# Patient Record
Sex: Male | Born: 1955 | Hispanic: No | Marital: Married | Smoking: Current every day smoker
Health system: Southern US, Community
[De-identification: ages and names within clinical notes are randomized; demographics above are authoritative.]

---

## 1999-09-06 ENCOUNTER — Ambulatory Visit (HOSPITAL_COMMUNITY): Admission: RE | Admit: 1999-09-06 | Discharge: 1999-09-06 | Payer: Self-pay | Admitting: Emergency Medicine

## 1999-09-06 ENCOUNTER — Encounter: Payer: Self-pay | Admitting: Emergency Medicine

## 1999-09-13 ENCOUNTER — Encounter: Payer: Self-pay | Admitting: Urology

## 1999-09-13 ENCOUNTER — Ambulatory Visit (HOSPITAL_COMMUNITY): Admission: RE | Admit: 1999-09-13 | Discharge: 1999-09-13 | Payer: Self-pay | Admitting: Urology

## 2000-11-12 ENCOUNTER — Emergency Department (HOSPITAL_COMMUNITY): Admission: EM | Admit: 2000-11-12 | Discharge: 2000-11-12 | Payer: Self-pay | Admitting: Emergency Medicine

## 2000-11-15 ENCOUNTER — Emergency Department (HOSPITAL_COMMUNITY): Admission: EM | Admit: 2000-11-15 | Discharge: 2000-11-15 | Payer: Self-pay | Admitting: *Deleted

## 2002-12-12 ENCOUNTER — Emergency Department (HOSPITAL_COMMUNITY): Admission: EM | Admit: 2002-12-12 | Discharge: 2002-12-12 | Payer: Self-pay | Admitting: Emergency Medicine

## 2004-09-05 ENCOUNTER — Inpatient Hospital Stay (HOSPITAL_COMMUNITY): Admission: EM | Admit: 2004-09-05 | Discharge: 2004-09-06 | Payer: Self-pay | Admitting: Emergency Medicine

## 2006-05-13 IMAGING — US US ABDOMEN COMPLETE
1 series · 14 of 25 positions shown · non-contrast
Comparison: none

CLINICAL DATA: Abdominal pain.  E.D. patient.
ABDOMEN ULTRASOUND:
TECHNIQUE: Complete abdominal ultrasound examination was performed including evaluation of the liver, gallbladder, bile ducts, pancreas, kidneys, spleen, IVC, and abdominal aorta.
Gallbladder distention.  Gallstone measuring approximately 1.6 cm in diameter is noted.  There is sludge in the gallbladder.  Wall thickness is 2.4 mm.  Negative Murphy?s sign.  Common duct caliber 4.7 mm is well within normal limits.  Findings compatible with diffuse hepatocellular disease, likely fatty infiltration.  Negative pancreas, spleen, and kidneys.  Spleen measures 11.2 cm in length and the right and left kidneys measures 11.9 and 13.1 cm in length, respectively.  Abdominal aorta maximum diameter is 1.9 cm.  Patent IVC.

[Series 1: unknown · 0.33mm/px · 14 of 55 slices shown]
[im 1/55]
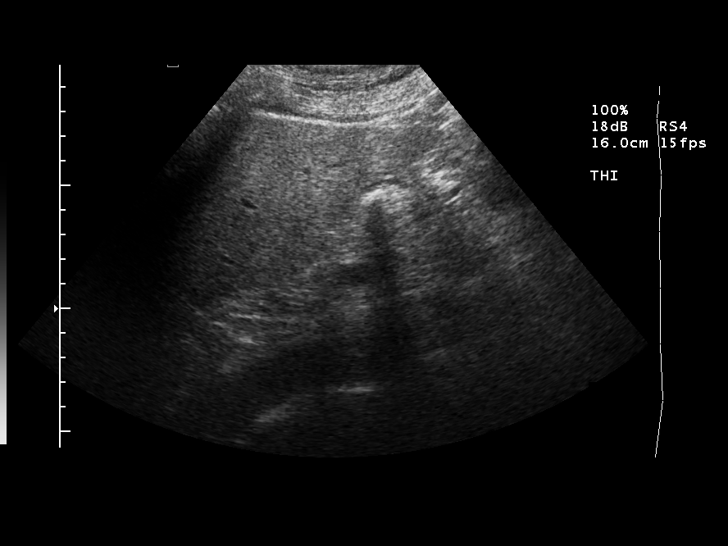
[im 5/55]
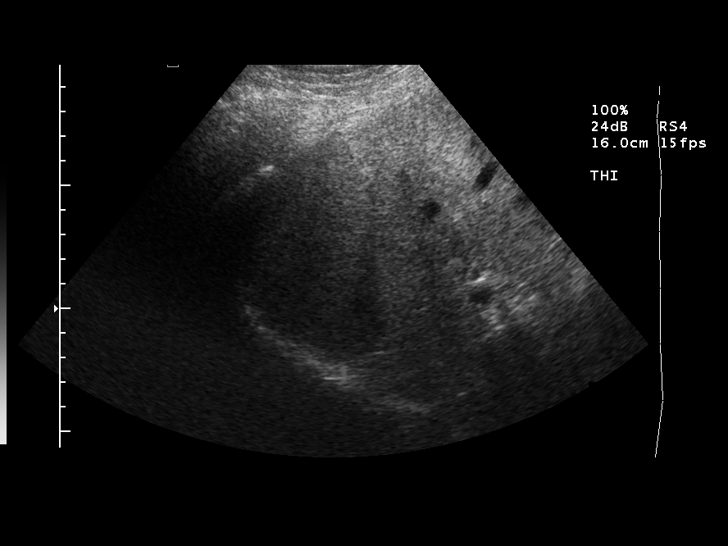
[im 10/55]
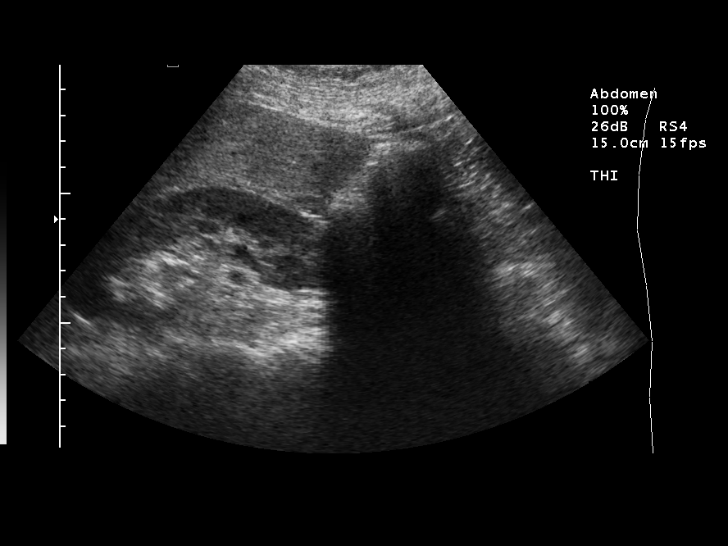
[im 14/55]
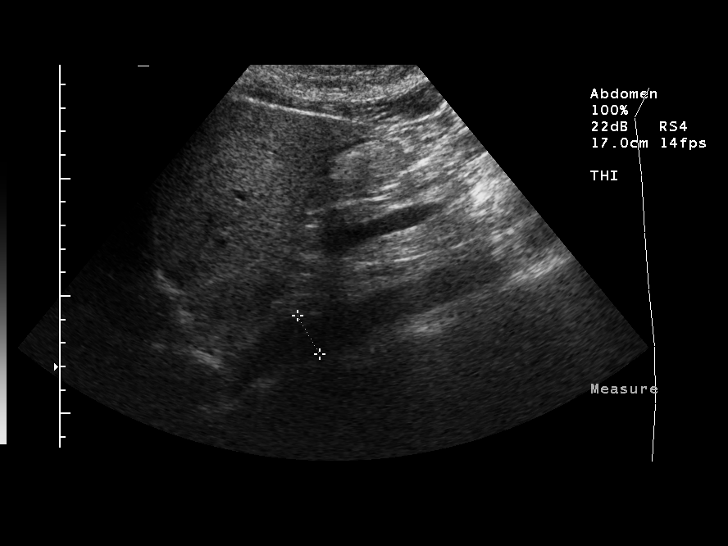
[im 19/55]
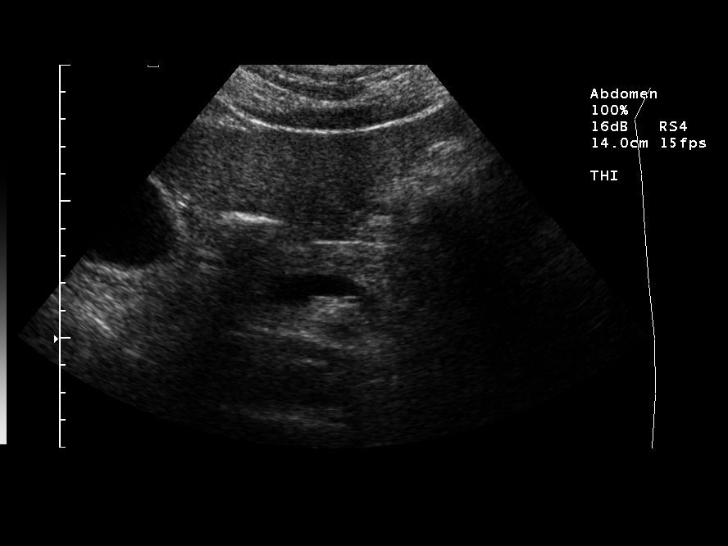
[im 21/55]
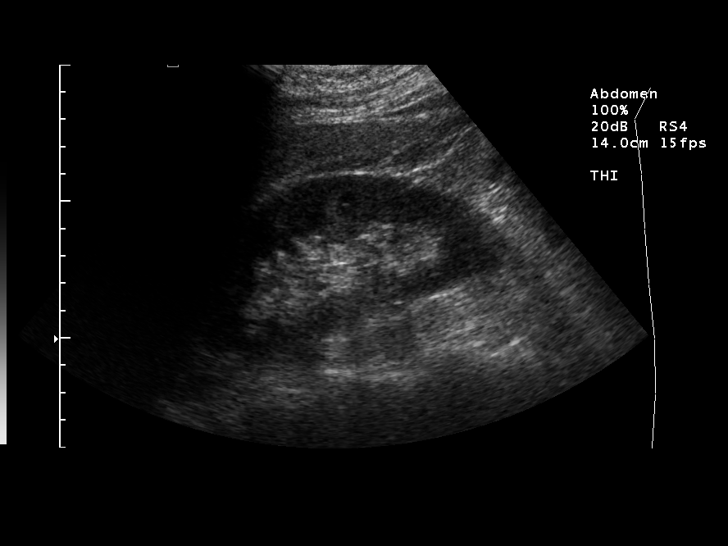
[im 25/55]
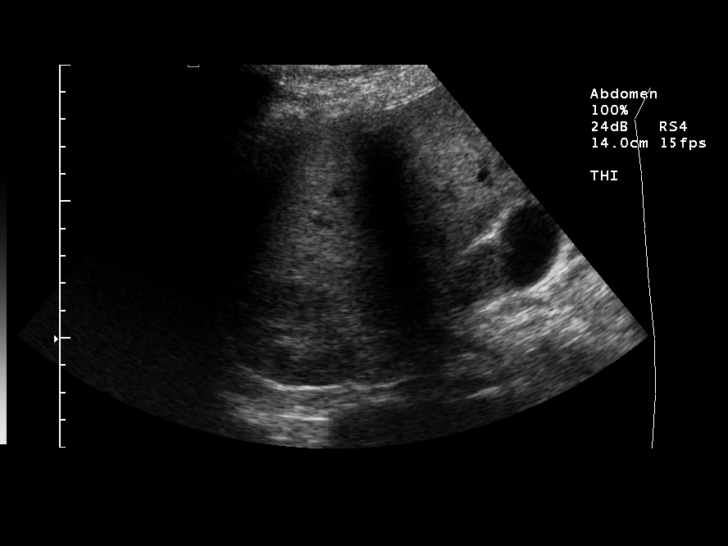
[im 30/55]
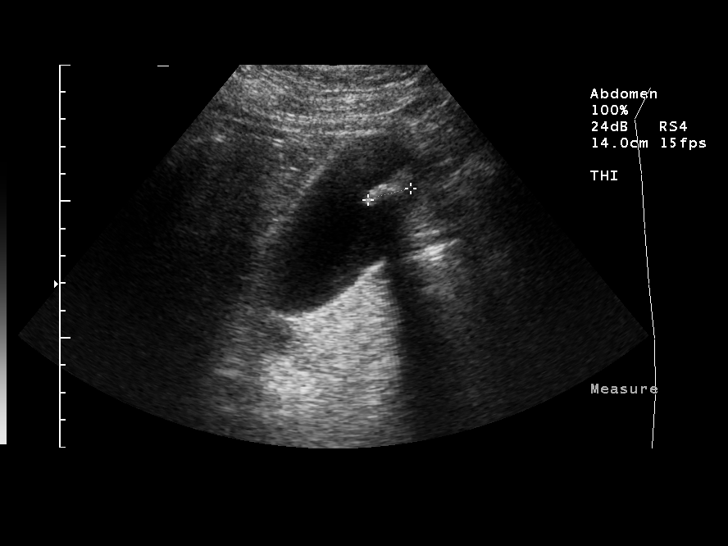
[im 34/55]
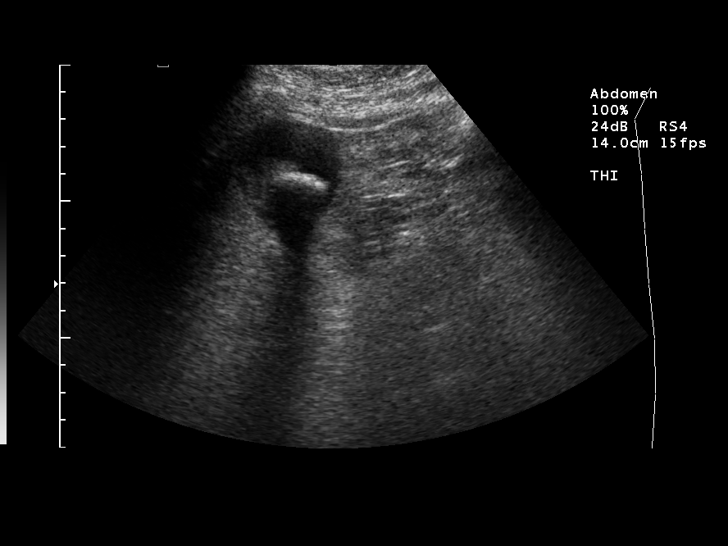
[im 37/55]
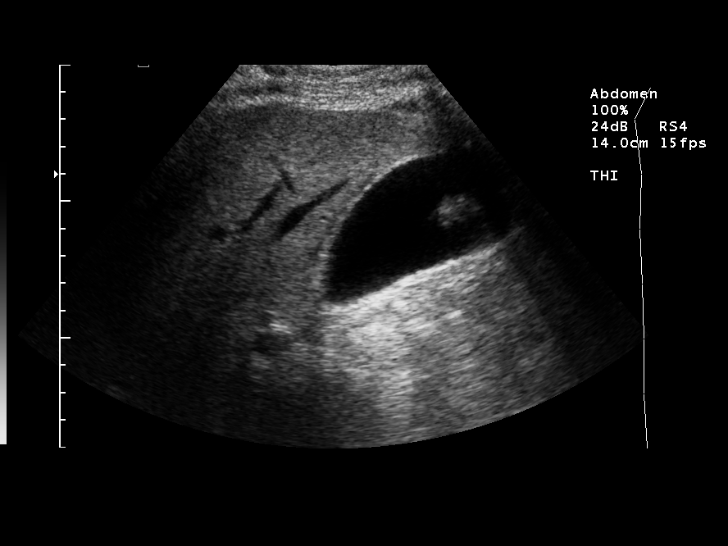
[im 41/55]
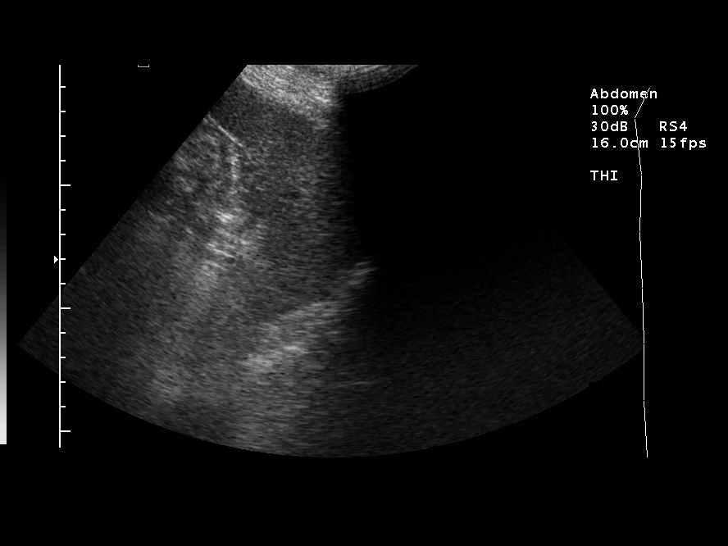
[im 46/55]
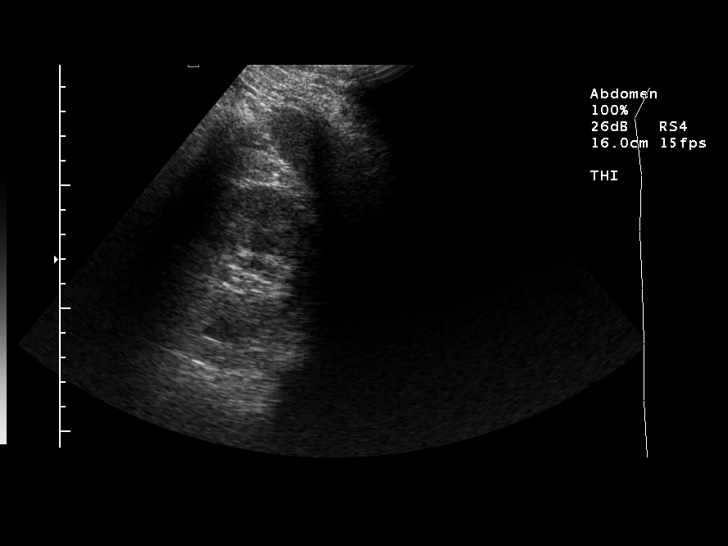
[im 50/55]
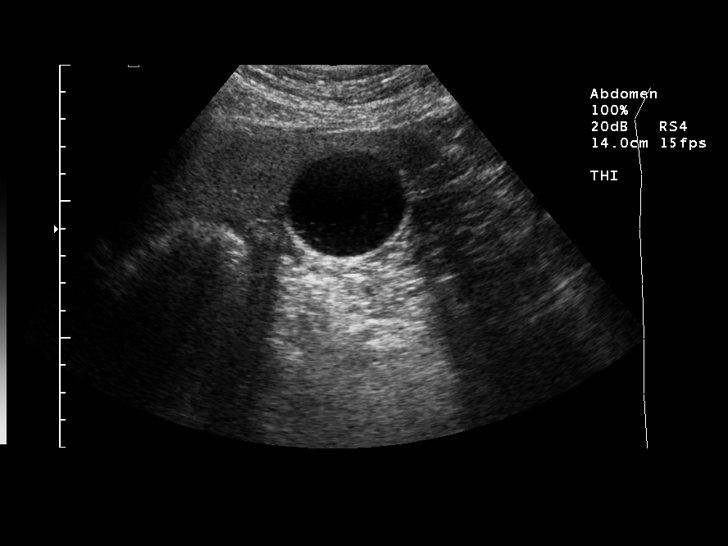
[im 55/55]
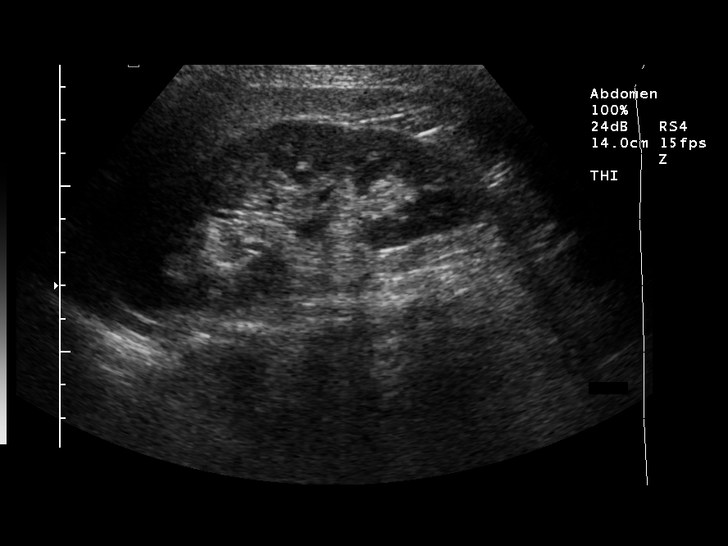

[14 of 25 positions shown; findings below may reference images not displayed]

IMPRESSION: Cholelithiasis.  Diffuse hepatocellular disease ? favor fatty infiltration.  No biliary ductal dilatation.

## 2006-05-13 IMAGING — RF DG CHOLANGIOGRAM OPERATIVE
1 series · 4 of 4 positions shown · non-contrast
Comparison: Abdominal ultrasound 09/05/04.

CLINICAL DATA: Acute cholecystitis.  
INTRAOPERATIVE CHOLANGIOGRAM:
A fluoroscopic series is submitted postoperatively from the operating room.

[Series 1: run · 4 of 49 frames shown]
[frame 8/49]
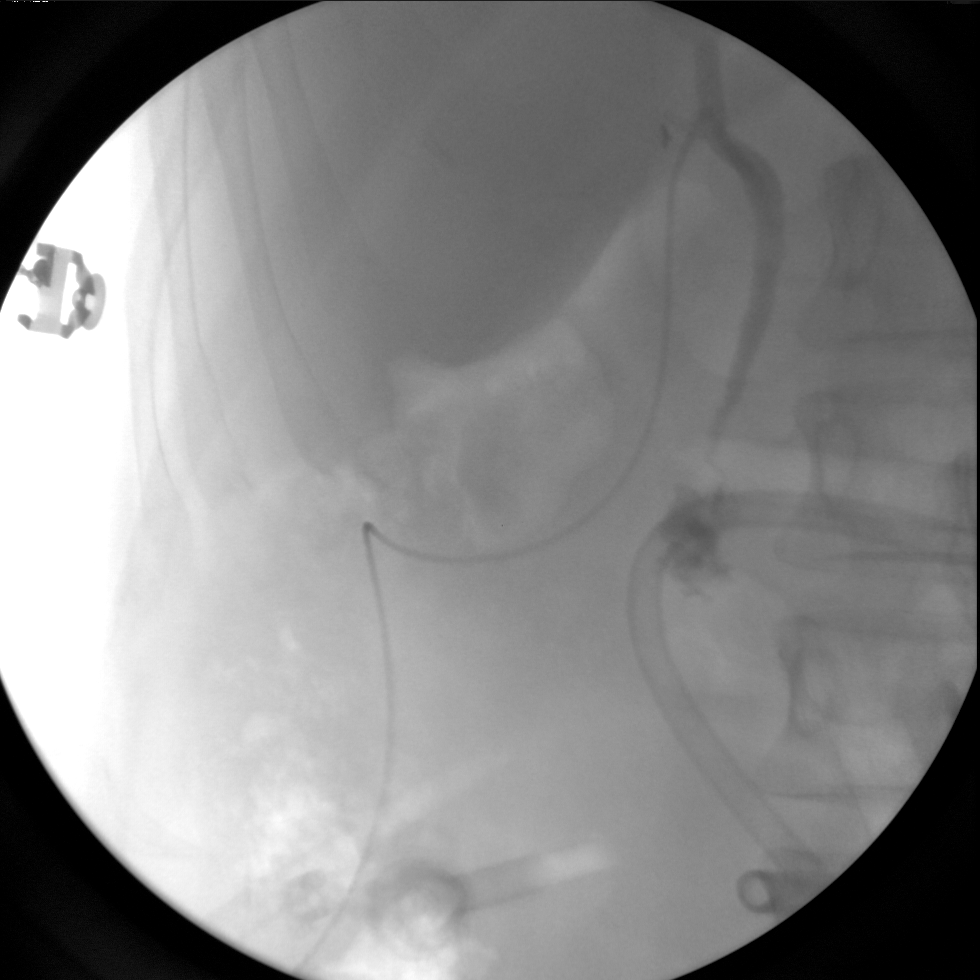
[frame 21/49]
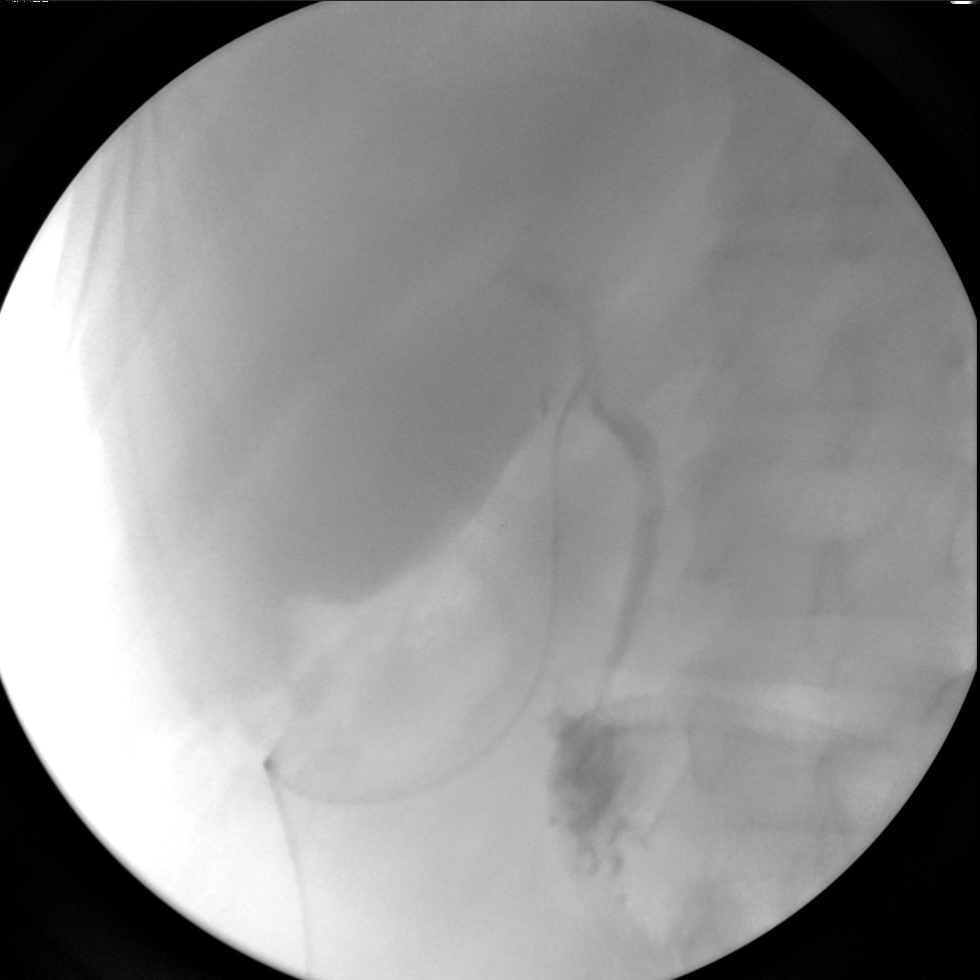
[frame 25/49]
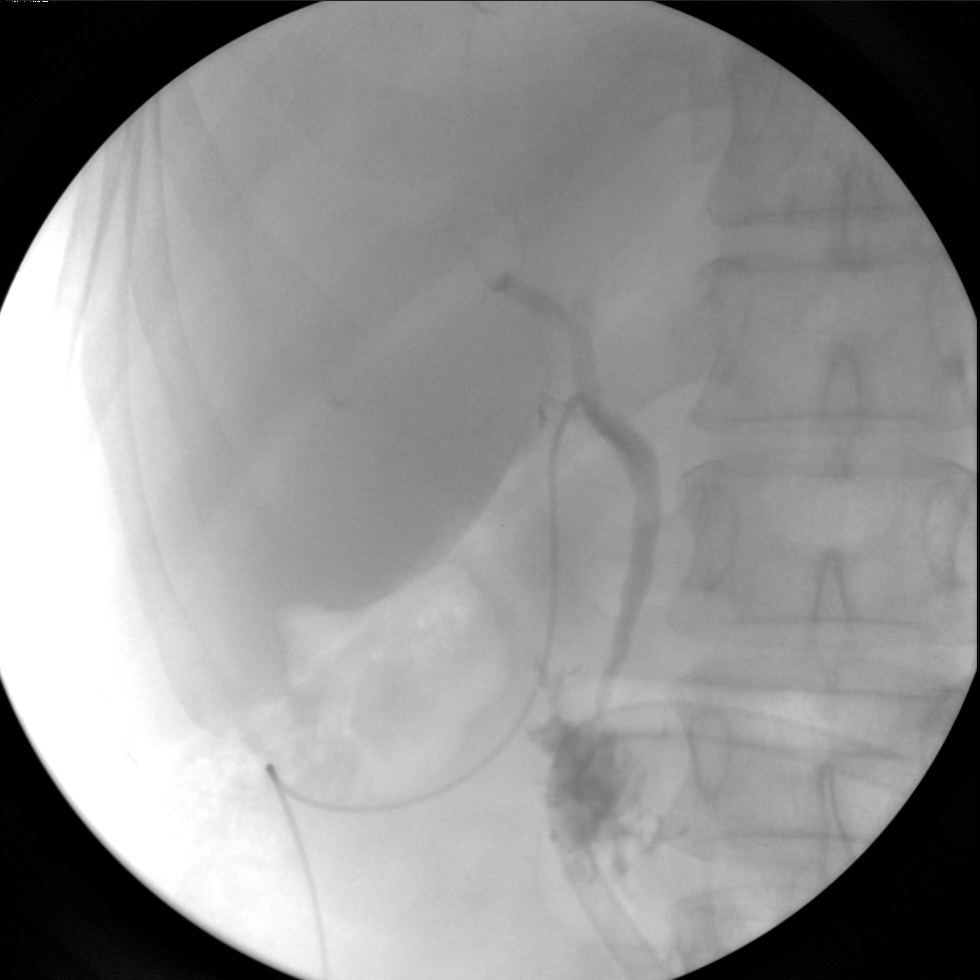
[frame 42/49]
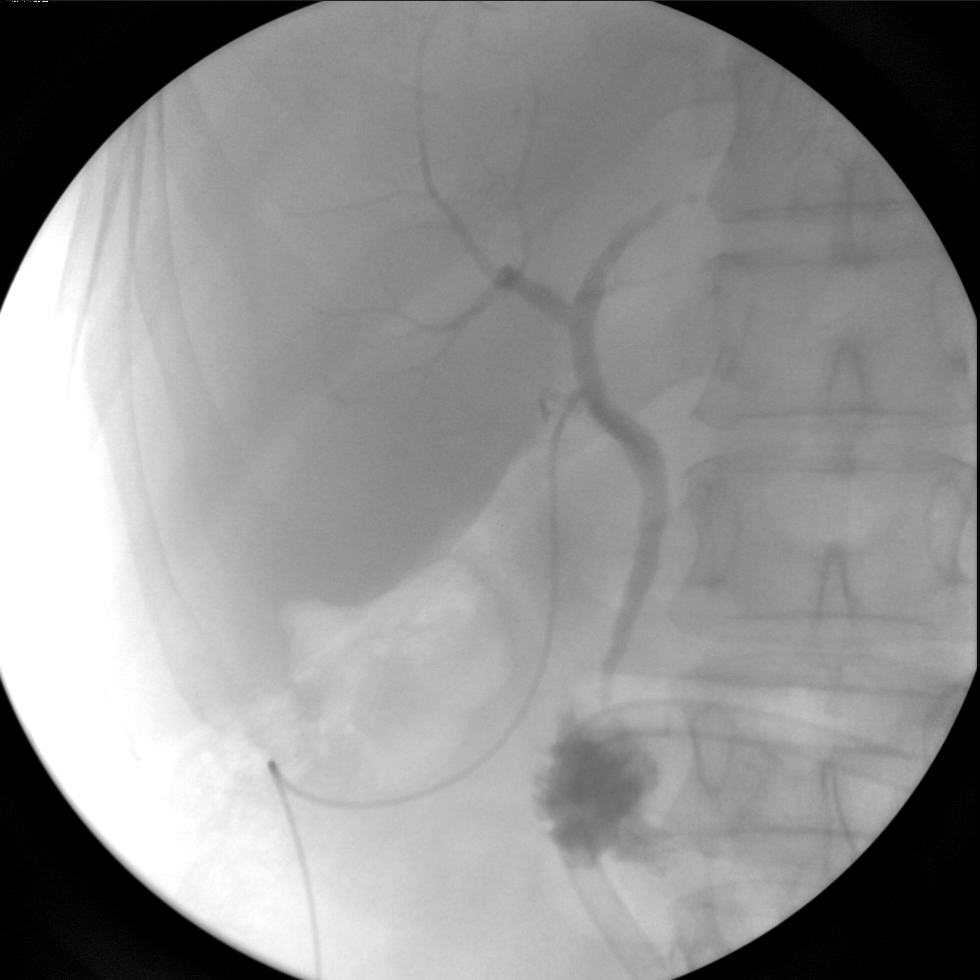

[4 of 4 positions shown; findings below may reference images not displayed]

FINDINGS: On injection of the cystic duct remnant, the common bile duct is noted to be normal in caliber without retained calculi.  There is drainage into the duodenum and no evidence of extravasation.
IMPRESSION: Negative intraoperative cholangiogram.

## 2006-05-13 IMAGING — CR DG CHEST 1V PORT
1 series · 1 of 1 positions shown · non-contrast
Comparison: None.

CLINICAL DATA: Abdominal pain. Precholecystectomy evaluation.

PORTABLE CHEST - 1 VIEW

[view not recorded]
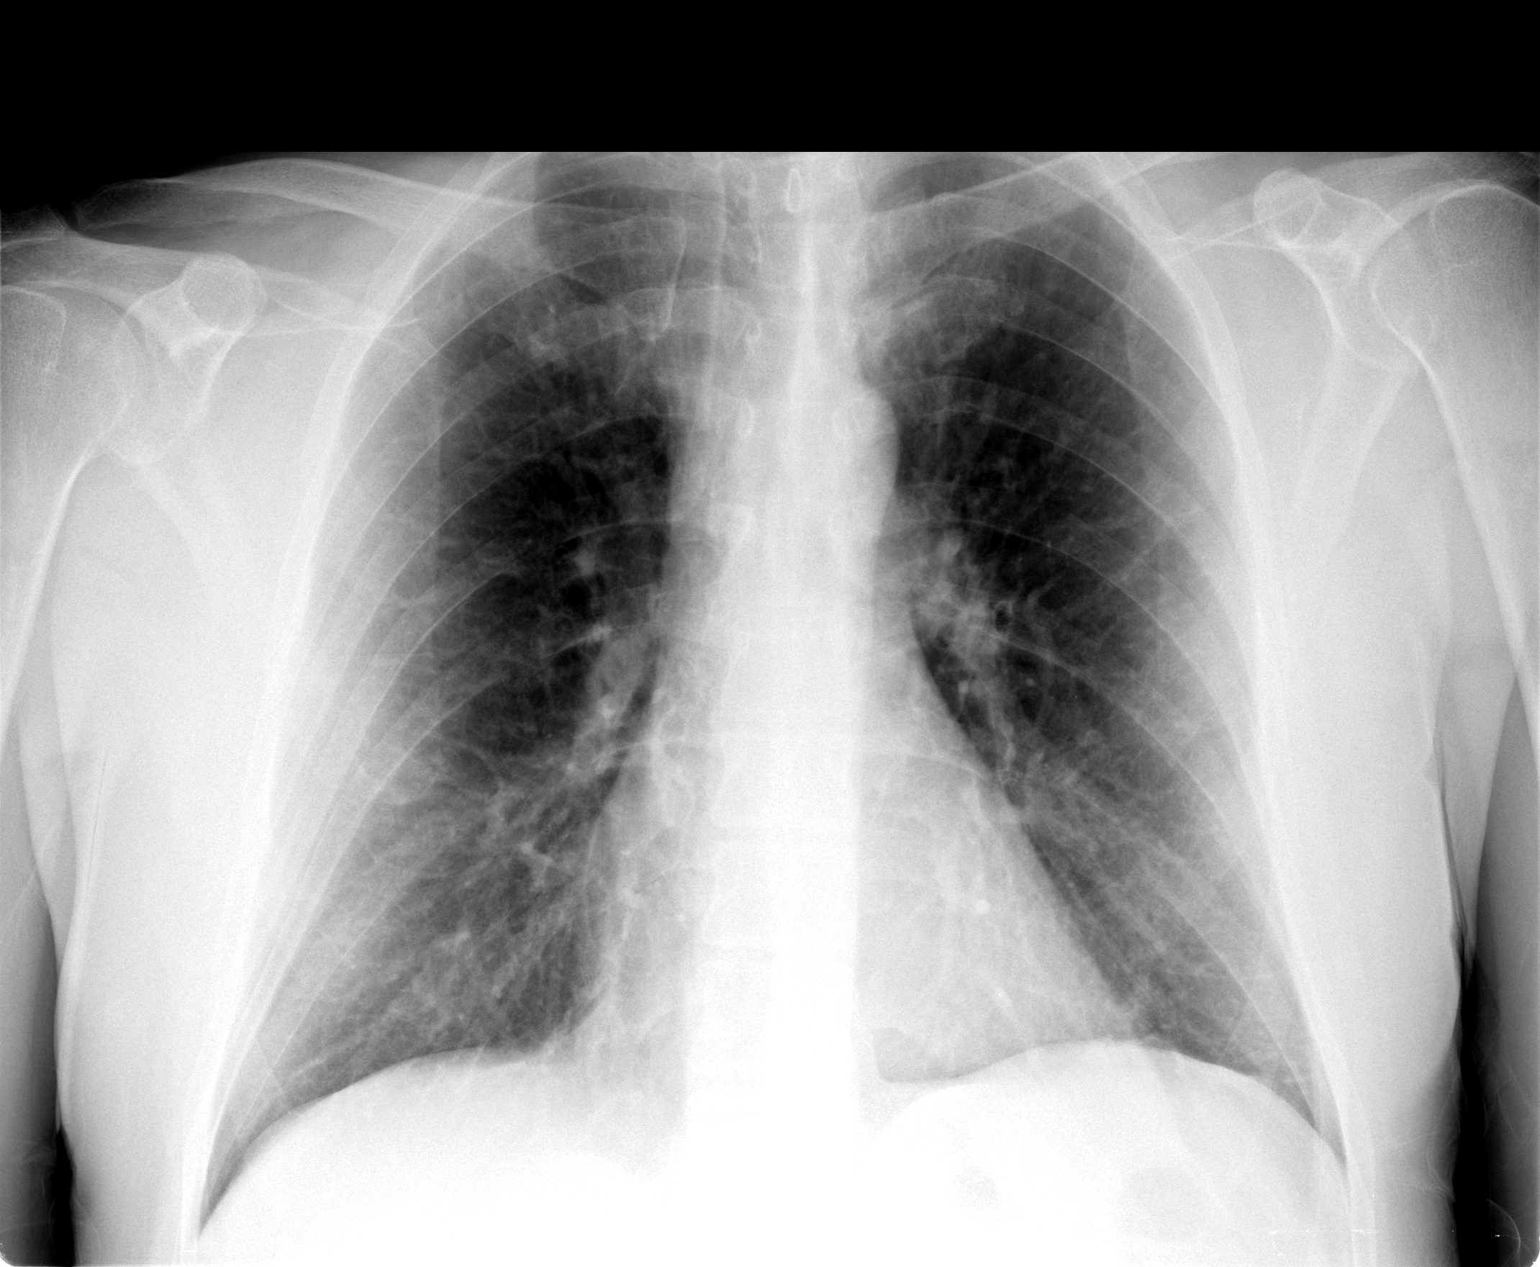

[1 of 1 positions shown; findings below may reference images not displayed]

FINDINGS: Normal sized heart. Hyperexpanded lungs with diffuse peribronchial
thickening and accentuation of the interstitial markings. Mild scoliosis.  

IMPRESSION

Changes of COPD and chronic bronchitis.

## 2014-06-05 DIAGNOSIS — F209 Schizophrenia, unspecified: Secondary | ICD-10-CM | POA: Diagnosis not present

## 2014-09-11 DIAGNOSIS — F209 Schizophrenia, unspecified: Secondary | ICD-10-CM | POA: Diagnosis not present

## 2014-12-27 DIAGNOSIS — F209 Schizophrenia, unspecified: Secondary | ICD-10-CM | POA: Diagnosis not present

## 2015-04-04 DIAGNOSIS — F209 Schizophrenia, unspecified: Secondary | ICD-10-CM | POA: Diagnosis not present

## 2015-06-26 DIAGNOSIS — F209 Schizophrenia, unspecified: Secondary | ICD-10-CM | POA: Diagnosis not present

## 2015-10-18 DIAGNOSIS — F209 Schizophrenia, unspecified: Secondary | ICD-10-CM | POA: Diagnosis not present

## 2016-01-10 DIAGNOSIS — F209 Schizophrenia, unspecified: Secondary | ICD-10-CM | POA: Diagnosis not present

## 2016-03-25 DIAGNOSIS — F209 Schizophrenia, unspecified: Secondary | ICD-10-CM | POA: Diagnosis not present

## 2016-08-13 DIAGNOSIS — F209 Schizophrenia, unspecified: Secondary | ICD-10-CM | POA: Diagnosis not present

## 2017-02-11 DIAGNOSIS — F209 Schizophrenia, unspecified: Secondary | ICD-10-CM | POA: Diagnosis not present

## 2017-06-12 DIAGNOSIS — F209 Schizophrenia, unspecified: Secondary | ICD-10-CM | POA: Diagnosis not present

## 2017-12-03 DIAGNOSIS — F209 Schizophrenia, unspecified: Secondary | ICD-10-CM | POA: Diagnosis not present

## 2018-04-08 DIAGNOSIS — F209 Schizophrenia, unspecified: Secondary | ICD-10-CM | POA: Diagnosis not present

## 2018-12-29 DIAGNOSIS — F209 Schizophrenia, unspecified: Secondary | ICD-10-CM | POA: Diagnosis not present

## 2019-03-24 DIAGNOSIS — F209 Schizophrenia, unspecified: Secondary | ICD-10-CM | POA: Diagnosis not present

## 2019-05-04 DIAGNOSIS — H2512 Age-related nuclear cataract, left eye: Secondary | ICD-10-CM | POA: Diagnosis not present

## 2019-05-04 DIAGNOSIS — H268 Other specified cataract: Secondary | ICD-10-CM | POA: Diagnosis not present

## 2019-05-04 DIAGNOSIS — H25042 Posterior subcapsular polar age-related cataract, left eye: Secondary | ICD-10-CM | POA: Diagnosis not present

## 2019-05-04 DIAGNOSIS — H25811 Combined forms of age-related cataract, right eye: Secondary | ICD-10-CM | POA: Diagnosis not present

## 2019-06-01 DIAGNOSIS — H2511 Age-related nuclear cataract, right eye: Secondary | ICD-10-CM | POA: Diagnosis not present

## 2019-06-01 DIAGNOSIS — H25811 Combined forms of age-related cataract, right eye: Secondary | ICD-10-CM | POA: Diagnosis not present

## 2019-06-01 DIAGNOSIS — H25041 Posterior subcapsular polar age-related cataract, right eye: Secondary | ICD-10-CM | POA: Diagnosis not present

## 2019-06-16 DIAGNOSIS — F209 Schizophrenia, unspecified: Secondary | ICD-10-CM | POA: Diagnosis not present

## 2019-06-29 ENCOUNTER — Ambulatory Visit: Payer: Self-pay | Attending: Internal Medicine

## 2019-06-29 DIAGNOSIS — Z23 Encounter for immunization: Secondary | ICD-10-CM

## 2019-06-29 NOTE — Progress Notes (Signed)
   Covid-19 Vaccination Clinic  Name:  Arthur Short    MRN: [id number] DOB: 1955/09/09  06/29/2019  Arthur Short was observed post Covid-19 immunization for 15 minutes without incident. He was provided with Vaccine Information Sheet and instruction to access the V-Safe system.   Arthur Short was instructed to call 911 with any severe reactions post vaccine: Marland Kitchen Difficulty breathing  . Swelling of face and throat  . A fast heartbeat  . A bad rash all over body  . Dizziness and weakness   Immunizations Administered    Name Date Dose VIS Date Route   Pfizer COVID-19 Vaccine 06/29/2019 10:58 AM 0.3 mL 02/25/2019 Intramuscular   Manufacturer: [employer], [employer]   Lot: [id number]   NDC: [address]-2

## 2019-06-30 ENCOUNTER — Ambulatory Visit: Payer: Self-pay

## 2019-07-12 ENCOUNTER — Ambulatory Visit (HOSPITAL_COMMUNITY)
Admission: EM | Admit: 2019-07-12 | Discharge: 2019-07-12 | Disposition: A | Discharge status: Home / Self Care | Payer: Medicare Other | Attending: Family Medicine | Admitting: Family Medicine

## 2019-07-12 ENCOUNTER — Encounter (HOSPITAL_COMMUNITY): Payer: Self-pay | Admitting: Emergency Medicine

## 2019-07-12 ENCOUNTER — Other Ambulatory Visit: Payer: Self-pay

## 2019-07-12 DIAGNOSIS — L259 Unspecified contact dermatitis, unspecified cause: Secondary | ICD-10-CM | POA: Diagnosis not present

## 2019-07-12 MED ORDER — PREDNISONE 10 MG PO TABS
ORAL_TABLET | ORAL | 0 refills | Status: AC
Start: 2019-07-12 — End: ?

## 2019-07-12 MED ORDER — METHYLPREDNISOLONE SODIUM SUCC 125 MG IJ SOLR
125.0000 mg | Freq: Once | INTRAMUSCULAR | Status: AC
  Administered 2019-07-12: 125 mg via INTRAMUSCULAR

## 2019-07-12 MED ORDER — METHYLPREDNISOLONE SODIUM SUCC 125 MG IJ SOLR
INTRAMUSCULAR | Status: AC
  Filled 2019-07-12: qty 2

## 2019-07-12 NOTE — Discharge Instructions (Signed)
We gave you an injection of Solu-Medrol today, begin with prednisone taper over the next 6 days beginning tomorrow in the morning.  Please take with food. May use daily Zyrtec/Claritin in the morning, Benadryl at nighttime to help with any further itching Please try applying moisturizers or Aveeno to arms to help with the dryness/peeling  Please follow-up if redness, rash, peeling spreading to other parts of body or having any difficulty breathing

## 2019-07-12 NOTE — ED Triage Notes (Signed)
PT used an Neurosurgeon to shave his arms yesterday. He has a red, itchy rash on both arms. He used hydrocortisone and rubbing alcohol on area last night.  He notes that he also saw fleas in his yard yesterday and frequently does yard work.

## 2019-07-13 NOTE — ED Provider Notes (Signed)
[facility]    CSN: [id number] Arrival date & time: 07/12/19  1504      History   Chief Complaint Chief Complaint  Patient presents with  . Rash    HPI Arthur Short is a 64 y.o. male presenting today for evaluation of a rash.  Patient notes that yesterday he shaved his arms with an Neurosurgeon.  Afterward he applied Vaseline restorative care along with some alcohol.  Since he has broken out in a red itchy rash on both arms.  He has also had associated peeling to these areas.  Mild pain, itch more prominent.  Has applied hydrocortisone.  He is concerned as he also believes there might be fleas around his house and often does yard work.  Denies rash elsewhere besides upper extremities, but does feel rash is spreading more proximally.  Denies difficulty breathing or shortness of breath.  Denies any other new exposures of foods, medicines or other hygiene products.  Has previously used Vaseline lotion without issue.  Also reports prior shaving without problems.   HPI  No past medical history on file.  There are no problems to display for this patient.   History reviewed. No pertinent surgical history.     Home Medications    Prior to Admission medications   Medication Sig Start Date End Date Taking? Authorizing Provider  amitriptyline (ELAVIL) 50 MG tablet Take 50 mg by mouth at bedtime.   Yes [provider]  perphenazine (TRILAFON) 2 MG tablet Take 2 mg by mouth at bedtime.   Yes [provider]  predniSONE (DELTASONE) 10 MG tablet Begin with 6 tabs on day 1, 5 tab on day 2, 4 tab on day 3, 3 tab on day 4, 2 tab on day 5, 1 tab on day 6-take with food 07/12/19   Reece Mcbroom, Maunie C, PA-C    Family History No family history on file.  Social History Social History   Tobacco Use  . Smoking status: Current Every Day Smoker    Packs/day: 1.00    Types: Cigarettes  . Smokeless tobacco: Never Used  Substance Use Topics  . Alcohol use: Yes   Comment: a few beers per week.   . Drug use: Never     Allergies   Patient has no known allergies.   Review of Systems Review of Systems  Constitutional: Negative for fatigue and fever.  Eyes: Negative for redness, itching and visual disturbance.  Respiratory: Negative for shortness of breath.   Cardiovascular: Negative for chest pain and leg swelling.  Gastrointestinal: Negative for nausea and vomiting.  Musculoskeletal: Negative for arthralgias and myalgias.  Skin: Positive for color change and rash. Negative for wound.  Neurological: Negative for dizziness, syncope, weakness, light-headedness and headaches.     Physical Exam Triage Vital Signs ED Triage Vitals  Enc Vitals Group     BP 07/12/19 1529 136/86     Pulse Rate 07/12/19 1529 (!) 117     Resp 07/12/19 1529 16     Temp 07/12/19 1529 98.7 F (37.1 C)     Temp Source 07/12/19 1529 Oral     SpO2 07/12/19 1529 97 %     Weight --      Height --      Head Circumference --      Peak Flow --      Pain Score 07/12/19 1527 2     Pain Loc --      Pain Edu? --  Excl. in GC? --    No data found.  Updated Vital Signs BP 136/86   Pulse (!) 117   Temp 98.7 F (37.1 C) (Oral)   Resp 16   SpO2 97%   Visual Acuity Right Eye Distance:   Left Eye Distance:   Bilateral Distance:    Right Eye Near:   Left Eye Near:    Bilateral Near:     Physical Exam Vitals and nursing note reviewed.  Constitutional:      Appearance: He is well-developed.     Comments: No acute distress  HENT:     Head: Normocephalic and atraumatic.     Nose: Nose normal.     Mouth/Throat:     Comments: Oral mucosa pink and moist, no tonsillar enlargement or exudate. Posterior pharynx patent and nonerythematous, no uvula deviation or swelling. Normal phonation. No lesions on oral mucosa, no oral swelling Eyes:     Conjunctiva/sclera: Conjunctivae normal.  Cardiovascular:     Rate and Rhythm: Normal rate.  Pulmonary:     Effort:  Pulmonary effort is normal. No respiratory distress.  Abdominal:     General: There is no distension.  Musculoskeletal:        General: Normal range of motion.     Cervical back: Neck supple.     Comments: Radial pulse 2+ bilaterally  Full active range of motion at joints of upper extremities  Skin:    General: Skin is warm and dry.     Comments: Bilateral upper extremities with diffuse erythema from distal forearm to mid upper arm, erythema becomes more patchy and more hive-like more proximally towards axilla, antecubital areas with associated skin breakdown and peeling bilaterally  Neurological:     Mental Status: He is alert and oriented to person, place, and time.      UC Treatments / Results  Labs (all labs ordered are listed, but only abnormal results are displayed) Labs Reviewed - No data to display  EKG   Radiology No results found.  Procedures Procedures (including critical care time)  Medications Ordered in UC Medications  methylPREDNISolone sodium succinate (SOLU-MEDROL) 125 mg/2 mL injection 125 mg (125 mg Intramuscular Given 07/12/19 1615)    Initial Impression / Assessment and Plan / UC Course  I have reviewed the triage vital signs and the nursing notes.  Pertinent labs & imaging results that were available during my care of the patient were reviewed by me and considered in my medical decision making (see chart for details).     Area around antecubital areas with peeling concerning for possible chemical burn, but given rash spreading into more patchy areas more approximately and history most suggestive of likely contact dermatitis.  Given associated itching opting to treat as contact dermatitis and writing course of prednisone.  Will provide Solu-Medrol IM prior to discharge and continue on prednisone taper x6 days beginning tomorrow morning.  Also recommend antihistamines as well as moisturizing efforts to areas with peeling.  Monitor for gradual  improvement.  If peeling spreading, rash spreading beyond affected area of arms, or developing any difficulty breathing, fevers to follow-up immediately.  Discussed strict return precautions. Patient verbalized understanding and is agreeable with plan.  Final Clinical Impressions(s) / UC Diagnoses   Final diagnoses:  Contact dermatitis, unspecified contact dermatitis type, unspecified trigger     Discharge Instructions     We gave you an injection of Solu-Medrol today, begin with prednisone taper over the next 6 days beginning tomorrow in the  morning.  Please take with food. May use daily Zyrtec/Claritin in the morning, Benadryl at nighttime to help with any further itching Please try applying moisturizers or Aveeno to arms to help with the dryness/peeling  Please follow-up if redness, rash, peeling spreading to other parts of body or having any difficulty breathing   ED Prescriptions    Medication Sig Dispense Auth. Provider   predniSONE (DELTASONE) 10 MG tablet Begin with 6 tabs on day 1, 5 tab on day 2, 4 tab on day 3, 3 tab on day 4, 2 tab on day 5, 1 tab on day 6-take with food 21 tablet Areta Terwilliger C, PA-C     PDMP not reviewed this encounter.   Lew Dawes, [place] 07/13/19 1129

## 2019-07-20 ENCOUNTER — Ambulatory Visit: Payer: Medicare Other | Attending: Internal Medicine

## 2019-07-20 DIAGNOSIS — Z23 Encounter for immunization: Secondary | ICD-10-CM

## 2019-07-20 NOTE — Progress Notes (Signed)
   Covid-19 Vaccination Clinic  Name:  Arthur Short    MRN: [id number] DOB: 1955/08/25  07/20/2019  Mr. Arthur Short was observed post Covid-19 immunization for 15 minutes without incident. He was provided with Vaccine Information Sheet and instruction to access the V-Safe system.   Mr. Arthur Short was instructed to call 911 with any severe reactions post vaccine: Marland Kitchen Difficulty breathing  . Swelling of face and throat  . A fast heartbeat  . A bad rash all over body  . Dizziness and weakness   Immunizations Administered    Name Date Dose VIS Date Route   Pfizer COVID-19 Vaccine 07/20/2019 10:53 AM 0.3 mL 05/11/2018 Intramuscular   Manufacturer: [employer], [employer]   Lot: [id number]   NDC: [address]-2

## 2019-09-22 DIAGNOSIS — F209 Schizophrenia, unspecified: Secondary | ICD-10-CM | POA: Diagnosis not present

## 2019-12-05 DIAGNOSIS — F209 Schizophrenia, unspecified: Secondary | ICD-10-CM | POA: Diagnosis not present

## 2020-03-23 DIAGNOSIS — F209 Schizophrenia, unspecified: Secondary | ICD-10-CM | POA: Diagnosis not present

## 2022-04-23 DIAGNOSIS — F209 Schizophrenia, unspecified: Secondary | ICD-10-CM | POA: Diagnosis not present

## 2022-09-23 DIAGNOSIS — F209 Schizophrenia, unspecified: Secondary | ICD-10-CM | POA: Diagnosis not present

## 2023-02-26 DIAGNOSIS — F209 Schizophrenia, unspecified: Secondary | ICD-10-CM | POA: Diagnosis not present

## 2023-05-27 DIAGNOSIS — F209 Schizophrenia, unspecified: Secondary | ICD-10-CM | POA: Diagnosis not present

## 2023-10-01 DIAGNOSIS — F209 Schizophrenia, unspecified: Secondary | ICD-10-CM | POA: Diagnosis not present

## 2023-12-22 DIAGNOSIS — F325 Major depressive disorder, single episode, in full remission: Secondary | ICD-10-CM | POA: Diagnosis not present

## 2023-12-22 DIAGNOSIS — F209 Schizophrenia, unspecified: Secondary | ICD-10-CM | POA: Diagnosis not present

## 2023-12-22 DIAGNOSIS — R030 Elevated blood-pressure reading, without diagnosis of hypertension: Secondary | ICD-10-CM | POA: Diagnosis not present

## 2024-01-21 DIAGNOSIS — F209 Schizophrenia, unspecified: Secondary | ICD-10-CM | POA: Diagnosis not present
